# Patient Record
Sex: Female | Born: 1965 | Race: Black or African American | Hispanic: No | Smoking: Former smoker
Health system: Southern US, Community
[De-identification: ages and names within clinical notes are randomized; demographics above are authoritative.]

---

## 2018-07-06 ENCOUNTER — Emergency Department (HOSPITAL_COMMUNITY)
Admission: EM | Admit: 2018-07-06 | Discharge: 2018-07-06 | Disposition: A | Discharge status: Home / Self Care | Payer: Self-pay | Attending: Emergency Medicine | Admitting: Emergency Medicine

## 2018-07-06 ENCOUNTER — Encounter (HOSPITAL_COMMUNITY): Payer: Self-pay | Admitting: Emergency Medicine

## 2018-07-06 ENCOUNTER — Other Ambulatory Visit: Payer: Self-pay

## 2018-07-06 DIAGNOSIS — R69 Illness, unspecified: Secondary | ICD-10-CM

## 2018-07-06 DIAGNOSIS — J111 Influenza due to unidentified influenza virus with other respiratory manifestations: Secondary | ICD-10-CM | POA: Insufficient documentation

## 2018-07-06 NOTE — ED Provider Notes (Signed)
[facility] EMERGENCY DEPARTMENT Provider Note   CSN: [id number] Arrival date & time: 07/06/18  1436    History   Chief Complaint Chief Complaint  Patient presents with  . Sore Throat  . Fever    HPI Sharon Mullins is a 53 y.o. female.     Patient presents with fever to 101, scratchy throat, nasal congestion, occasional non prod cough, body aches, in past few days. Symptoms acute onset, mild-moderate, persistent, constant. No sob. No recent travel or known covid exposure. No known ill contacts.   The history is provided by the patient.  Sore Throat  Pertinent negatives include no chest pain, no abdominal pain, no headaches and no shortness of breath.  Fever  Associated symptoms: cough, myalgias, rhinorrhea and sore throat   Associated symptoms: no chest pain, no chills, no confusion, no diarrhea, no headaches, no rash and no vomiting     History reviewed. No pertinent past medical history.  There are no active problems to display for this patient.   History reviewed. No pertinent surgical history.   OB History   No obstetric history on file.      Home Medications    Prior to Admission medications   Not on File    Family History No family history on file.  Social History Social History   Tobacco Use  . Smoking status: Not on file  Substance Use Topics  . Alcohol use: Not on file  . Drug use: Not on file     Allergies   Patient has no allergy information on record.   Review of Systems Review of Systems  Constitutional: Positive for fever. Negative for chills.  HENT: Positive for rhinorrhea and sore throat.   Eyes: Negative for redness.  Respiratory: Positive for cough. Negative for shortness of breath.   Cardiovascular: Negative for chest pain.  Gastrointestinal: Negative for abdominal pain, diarrhea and vomiting.  Genitourinary: Negative for flank pain.  Musculoskeletal: Positive for myalgias. Negative for back pain and neck  pain.  Skin: Negative for rash.  Neurological: Negative for headaches.  Hematological: Does not bruise/bleed easily.  Psychiatric/Behavioral: Negative for confusion.     Physical Exam Updated Vital Signs BP 135/85 (BP Location: Right Arm)   Pulse 90   Temp 97.8 F (36.6 C) (Oral)   Resp 18   Ht 1.499 m (4\' 11" )   Wt 76.2 kg   LMP 07/03/2018 (Exact Date)   SpO2 100%   BMI 33.93 kg/m   Physical Exam Vitals signs and nursing note reviewed.  Constitutional:      Appearance: Normal appearance. She is well-developed.  HENT:     Head: Atraumatic.     Right Ear: Tympanic membrane normal.     Left Ear: Tympanic membrane normal.     Nose: Congestion present.     Mouth/Throat:     Mouth: Mucous membranes are moist.     Pharynx: Oropharynx is clear. No oropharyngeal exudate or posterior oropharyngeal erythema.  Eyes:     General: No scleral icterus.    Conjunctiva/sclera: Conjunctivae normal.     Pupils: Pupils are equal, round, and reactive to light.  Neck:     Musculoskeletal: Normal range of motion and neck supple. No neck rigidity or muscular tenderness.     Trachea: No tracheal deviation.  Cardiovascular:     Rate and Rhythm: Normal rate and regular rhythm.     Pulses: Normal pulses.     Heart sounds: Normal heart sounds. No murmur.  No friction rub. No gallop.   Pulmonary:     Effort: Pulmonary effort is normal. No respiratory distress.     Breath sounds: Normal breath sounds.  Abdominal:     General: Bowel sounds are normal. There is no distension.     Palpations: Abdomen is soft.     Tenderness: There is no abdominal tenderness. There is no guarding.  Genitourinary:    Comments: No cva tenderness.  Musculoskeletal:        General: No swelling.  Lymphadenopathy:     Cervical: No cervical adenopathy.  Skin:    General: Skin is warm and dry.     Findings: No rash.  Neurological:     Mental Status: She is alert.     Comments: Alert, speech normal.   Psychiatric:         Mood and Affect: Mood normal.      ED Treatments / Results  Labs (all labs ordered are listed, but only abnormal results are displayed) Labs Reviewed - No data to display  EKG None  Radiology No results found.  Procedures Procedures (including critical care time)  Medications Ordered in ED Medications - No data to display   Initial Impression / Assessment and Plan / ED Course  I have reviewed the triage vital signs and the nursing notes.  Pertinent labs & imaging results that were available during my care of the patient were reviewed by me and considered in my medical decision making (see chart for details).  Reviewed nursing notes and prior charts for additional history.   Sharon Mullins was evaluated in Emergency Department on 07/06/2018 for the symptoms described in the history of present illness. She was evaluated in the context of the global COVID-19 pandemic, which necessitated consideration that the patient might be at risk for infection with the SARS-CoV-2 virus that causes COVID-19. Institutional protocols and algorithms that pertain to the evaluation of patients at risk for COVID-19 are in a state of rapid change based on information released by regulatory bodies including the CDC and federal and state organizations. These policies and algorithms were followed during the patient's care in the ED.  Patients exam/symptoms c/w viral uri.   Provided/reinforced quarantine instructions.   Patient appears stable for d/c.     Final Clinical Impressions(s) / ED Diagnoses   Final diagnoses:  None    ED Discharge Orders    None       Cathren Laine, MD 07/06/18 1506

## 2018-07-06 NOTE — ED Triage Notes (Signed)
Pt having sore throat headache and fever since last night. Tempo at home 101. Temp on arrival 97.8. pT HAVING GENERALIZED BODY ACHES

## 2018-07-06 NOTE — Discharge Instructions (Signed)
It was our pleasure to provide your ER care today - we hope that you feel better.  Stay at home for atleast 7 days after onset of symptoms, and atleast 3 days after resolution of fever/cough.   Coronavirus (COVID-19) Are you at risk?  Are you at risk for the Coronavirus (COVID-19)?  To be considered HIGH RISK for Coronavirus (COVID-19), you have to meet the following criteria:  Traveled to [place], [place], [place], [place] or [place]; or in the [place] to [place], [place], [place], or [place]; and have fever, cough, and shortness of breath within the last 2 weeks of travel OR Been in close contact with a person diagnosed with COVID-19 within the last 2 weeks and have fever, cough, and shortness of breath IF YOU DO NOT MEET THESE CRITERIA, YOU ARE CONSIDERED LOW RISK FOR COVID-19.  What to do if you are HIGH RISK for COVID-19?  If you are having a medical emergency, call 911. Seek medical care right away. Before you go to a doctors office, urgent care or emergency department, call ahead and tell them about your recent travel, contact with someone diagnosed with COVID-19, and your symptoms. You should receive instructions from your physicians office regarding next steps of care.  When you arrive at healthcare provider, tell the healthcare staff immediately you have returned from visiting [place], [place], [place], [place] or [place]; or traveled in the [place] to [place], [place], [place], or [place]; in the last two weeks or you have been in close contact with a person diagnosed with COVID-19 in the last 2 weeks.   Tell the health care staff about your symptoms: fever, cough and shortness of breath. After you have been seen by a medical provider, you will be either: Tested for (COVID-19) and discharged home on quarantine except to seek medical care if symptoms worsen, and asked to  Stay home and avoid contact with others until you get your results (4-5 days)  Avoid  travel on public transportation if possible (such as bus, train, or airplane) or Sent to the Emergency Department by EMS for evaluation, COVID-19 testing, and possible admission depending on your condition and test results.  What to do if you are LOW RISK for COVID-19?  Reduce your risk of any infection by using the same precautions used for avoiding the common cold or flu:  Wash your hands often with soap and warm water for at least 20 seconds.  If soap and water are not readily available, use an alcohol-based hand sanitizer with at least 60% alcohol.  If coughing or sneezing, cover your mouth and nose by coughing or sneezing into the elbow areas of your shirt or coat, into a tissue or into your sleeve (not your hands). Avoid shaking hands with others and consider head nods or verbal greetings only. Avoid touching your eyes, nose, or mouth with unwashed hands.  Avoid close contact with people who are sick. Avoid places or events with large numbers of people in one location, like concerts or sporting events. Carefully consider travel plans you have or are making. If you are planning any travel outside or inside the [place], visit the [facility] webpage for the latest health notices. If you have some symptoms but not all symptoms, continue to monitor at home and seek medical attention if your symptoms worsen. If you are having a medical emergency, call 911.   ADDITIONAL HEALTHCARE OPTIONS FOR PATIENTS  [facility] / e-Visit:  [email or web address]         [facility]: [phone]  [facility]: [phone]                   [facility]: [phone]       Person Under Monitoring Name: Karolee OhsLisa Friedel  Location[place] [address] [place] [id number]   Infection Prevention Recommendations for Individuals Confirmed to have, or Being Evaluated for, 2019 Novel Coronavirus (COVID-19) Infection  Who Receive Care at Home  Individuals who are confirmed to have, or are being evaluated for, COVID-19 should follow the prevention steps below until a healthcare provider or local or state health department says they can return to normal activities.  Stay home except to get medical care You should restrict activities outside your home, except for getting medical care. Do not go to work, school, or public areas, and do not use public transportation or taxis.  Call ahead before visiting your doctor Before your medical appointment, call the healthcare provider and tell them that you have, or are being evaluated for, COVID-19 infection. This will help the healthcare providers office take steps to keep other people from getting infected. Ask your healthcare provider to call the local or state health department.  Monitor your symptoms Seek prompt medical attention if your illness is worsening (e.g., difficulty breathing). Before going to your medical appointment, call the healthcare provider and tell them that you have, or are being evaluated for, COVID-19 infection. Ask your healthcare provider to call the local or state health department.  Wear a facemask You should wear a facemask that covers your nose and mouth when you are in the same room with other people and when you visit a healthcare provider. People who live with or visit you should also wear a facemask while they are in the same room with you.  Separate yourself from other people in your home As much as possible, you should stay in a different room from other people in your home. Also, you should use a separate bathroom, if available.  Avoid sharing household items You should not share dishes, drinking glasses, cups, eating utensils, towels, bedding, or other items with other people in your home. After using these items, you should wash them thoroughly with soap and water.  Cover your coughs and sneezes Cover your mouth and  nose with a tissue when you cough or sneeze, or you can cough or sneeze into your sleeve. Throw used tissues in a lined trash can, and immediately wash your hands with soap and water for at least 20 seconds or use an alcohol-based hand rub.  Wash your [employer]hands Wash your hands often and thoroughly with soap and water for at least 20 seconds. You can use an alcohol-based hand sanitizer if soap and water are not available and if your hands are not visibly dirty. Avoid touching your eyes, nose, and mouth with unwashed hands.   Prevention Steps for Caregivers and Household Members of Individuals Confirmed to have, or Being Evaluated for, COVID-19 Infection Being Cared for in the Home  If you live with, or provide care at home for, a person confirmed to have, or being evaluated for, COVID-19 infection please follow these guidelines to prevent infection:  Follow healthcare providers instructions Make sure that you understand and can help the patient follow any healthcare provider instructions for all care.  Provide for the patients basic needs You should help the patient with basic needs in the home and  provide support for getting groceries, prescriptions, and other personal needs.  Monitor the patients symptoms If they are getting sicker, call his or her medical provider and tell them that the patient has, or is being evaluated for, COVID-19 infection. This will help the healthcare providers office take steps to keep other people from getting infected. Ask the healthcare provider to call the local or state health department.  Limit the number of people who have contact with the patient If possible, have only one caregiver for the patient. Other household members should stay in another home or place of residence. If this is not possible, they should stay in another room, or be separated from the patient as much as possible. Use a separate bathroom, if available. Restrict visitors who do not  have an essential need to be in the home.  Keep older adults, very young children, and other sick people away from the patient Keep older adults, very young children, and those who have compromised immune systems or chronic health conditions away from the patient. This includes people with chronic heart, lung, or kidney conditions, diabetes, and cancer.  Ensure good ventilation Make sure that shared spaces in the home have good air flow, such as from an air conditioner or an opened window, weather permitting.  Wash your hands often Wash your hands often and thoroughly with soap and water for at least 20 seconds. You can use an alcohol based hand sanitizer if soap and water are not available and if your hands are not visibly dirty. Avoid touching your eyes, nose, and mouth with unwashed hands. Use disposable paper towels to dry your hands. If not available, use dedicated cloth towels and replace them when they become wet.  Wear a facemask and gloves Wear a disposable facemask at all times in the room and gloves when you touch or have contact with the patients blood, body fluids, and/or secretions or excretions, such as sweat, saliva, sputum, nasal mucus, vomit, urine, or feces.  Ensure the mask fits over your nose and mouth tightly, and do not touch it during use. Throw out disposable facemasks and gloves after using them. Do not reuse. Wash your hands immediately after removing your facemask and gloves. If your personal clothing becomes contaminated, carefully remove clothing and launder. Wash your hands after handling contaminated clothing. Place all used disposable facemasks, gloves, and other waste in a lined container before disposing them with other household waste. Remove gloves and wash your hands immediately after handling these items.  Do not share dishes, glasses, or other household items with the patient Avoid sharing household items. You should not share dishes, drinking glasses,  cups, eating utensils, towels, bedding, or other items with a patient who is confirmed to have, or being evaluated for, COVID-19 infection. After the person uses these items, you should wash them thoroughly with soap and water.  Wash laundry thoroughly Immediately remove and wash clothes or bedding that have blood, body fluids, and/or secretions or excretions, such as sweat, saliva, sputum, nasal mucus, vomit, urine, or feces, on them. Wear gloves when handling laundry from the patient. Read and follow directions on labels of laundry or clothing items and detergent. In general, wash and dry with the warmest temperatures recommended on the label.  Clean all areas the individual has used often Clean all touchable surfaces, such as counters, tabletops, doorknobs, bathroom fixtures, toilets, phones, keyboards, tablets, and bedside tables, every day. Also, clean any surfaces that may have blood, body fluids, and/or secretions or  excretions on them. Wear gloves when cleaning surfaces the patient has come in contact with. Use a diluted bleach solution (e.g., dilute bleach with 1 part bleach and 10 parts water) or a household disinfectant with a label that says EPA-registered for coronaviruses. To make a bleach solution at home, add 1 tablespoon of bleach to 1 quart (4 cups) of water. For a larger supply, add  cup of bleach to 1 gallon (16 cups) of water. Read labels of cleaning products and follow recommendations provided on product labels. Labels contain instructions for safe and effective use of the cleaning product including precautions you should take when applying the product, such as wearing gloves or eye protection and making sure you have good ventilation during use of the product. Remove gloves and wash hands immediately after cleaning.  Monitor yourself for signs and symptoms of illness Caregivers and household members are considered close contacts, should monitor their health, and will be asked  to limit movement outside of the home to the extent possible. Follow the monitoring steps for close contacts listed on the symptom monitoring form.   ? If you have additional questions, contact your local health department or call the epidemiologist on call at [phone] (available 24/7). ? This guidance is subject to change. For the most up-to-date guidance from [facility], please refer to their website: [email or web address]

## 2019-06-01 ENCOUNTER — Emergency Department (HOSPITAL_COMMUNITY)
Admission: EM | Admit: 2019-06-01 | Discharge: 2019-06-01 | Disposition: A | Discharge status: Home / Self Care | Payer: Self-pay | Attending: Emergency Medicine | Admitting: Emergency Medicine

## 2019-06-01 ENCOUNTER — Other Ambulatory Visit: Payer: Self-pay

## 2019-06-01 ENCOUNTER — Encounter (HOSPITAL_COMMUNITY): Payer: Self-pay

## 2019-06-01 DIAGNOSIS — Y929 Unspecified place or not applicable: Secondary | ICD-10-CM | POA: Insufficient documentation

## 2019-06-01 DIAGNOSIS — Y939 Activity, unspecified: Secondary | ICD-10-CM | POA: Insufficient documentation

## 2019-06-01 DIAGNOSIS — Y999 Unspecified external cause status: Secondary | ICD-10-CM | POA: Insufficient documentation

## 2019-06-01 DIAGNOSIS — M791 Myalgia, unspecified site: Secondary | ICD-10-CM | POA: Insufficient documentation

## 2019-06-01 DIAGNOSIS — W19XXXA Unspecified fall, initial encounter: Secondary | ICD-10-CM

## 2019-06-01 DIAGNOSIS — W109XXA Fall (on) (from) unspecified stairs and steps, initial encounter: Secondary | ICD-10-CM | POA: Insufficient documentation

## 2019-06-01 MED ORDER — METHOCARBAMOL 500 MG PO TABS: 500.0000 mg | ORAL_TABLET | Freq: Two times a day (BID) | ORAL | 0 refills | Status: DC

## 2019-06-01 NOTE — ED Triage Notes (Signed)
Pt reports she slipped on ice, falling down about 10 steps yesterday. Denies LOC. Pt c.o pain on the right side of her body from the shoulder down to her leg. Pt ambulatory.

## 2019-06-01 NOTE — ED Provider Notes (Signed)
[facility] EMERGENCY DEPARTMENT Provider Note   CSN: [id number] Arrival date & time: 06/01/19  1516     History Chief Complaint  Patient presents with  . Fall    Sharon Mullins is a 54 y.o. female.  HPI  Patient is a 54 year old female with no significant past medical history presented today for diffuse body aches/muscle aches that began after 9 AM yesterday when she slipped on some metal steps and slid down 10 steps to the ground.  Patient states she not hit her head, did not lose consciousness, did not sustain any lacerations or abrasions.  She states she was wearing a thick coat heavy clothes which cushions her back somewhat.  However she states that when she woke up this morning she felt much more achy than she did yesterday.  She states that her pain is primarily on her right side of her body.  She states she is having some right-sided low back pain as well as stiffness and aches in her right shoulder.  Patient denies any headache, dizziness, lightheadedness.  She states that her fall was purely mechanical because of ice and denies any chest pain, palpitations, shortness of breath triggering her fall.  She also denies any other symptoms after a fall.  States that she was ambulatory immediately after incident.     History reviewed. No pertinent past medical history.  There are no problems to display for this patient.   History reviewed. No pertinent surgical history.   OB History   No obstetric history on file.     No family history on file.  Social History   Tobacco Use  . Smoking status: Not on file  Substance Use Topics  . Alcohol use: Not on file  . Drug use: Not on file    Home Medications Prior to Admission medications   Medication Sig Start Date End Date Taking? Authorizing Provider  methocarbamol (ROBAXIN) 500 MG tablet Take 1 tablet (500 mg total) by mouth 2 (two) times daily. 06/01/19   Gailen Shelter, PA    Allergies    Patient  has no allergy information on record.  Review of Systems   Review of Systems  Constitutional: Negative for fever.  HENT: Negative for congestion.   Respiratory: Negative for shortness of breath.   Cardiovascular: Negative for chest pain.  Gastrointestinal: Negative for abdominal distention.  Musculoskeletal:       Right-sided low back pain, right shoulder pain at the back  Neurological: Negative for dizziness, tremors, syncope, speech difficulty, weakness, light-headedness, numbness and headaches.    Physical Exam Updated Vital Signs BP (!) 154/87 (BP Location: Right Arm)   Pulse 88   Temp 98.4 F (36.9 C) (Oral)   Resp 14   SpO2 100%   Physical Exam Vitals and nursing note reviewed.  Constitutional:      General: She is not in acute distress.    Comments: Patient is pleasant 54 year old female in no acute distress sitting comfortably in bed  HENT:     Head: Normocephalic and atraumatic.     Nose: Nose normal.  Eyes:     General: No scleral icterus. Neck:     Comments: No tenderness to palpation of neck. Cardiovascular:     Rate and Rhythm: Normal rate and regular rhythm.     Pulses: Normal pulses.     Heart sounds: Normal heart sounds.  Pulmonary:     Effort: Pulmonary effort is normal. No respiratory distress.  Breath sounds: No wheezing.  Abdominal:     Palpations: Abdomen is soft.     Tenderness: There is no abdominal tenderness. There is no guarding or rebound.  Musculoskeletal:     Cervical back: Normal range of motion.     Right lower leg: No edema.     Left lower leg: No edema.     Comments: Diffuse tenderness to palpation of muscle groups of the right lower back.  She has no midline spinal tenderness to palpation.  No tenderness of hips, knees, ankles, feet, wrists, or shoulder joints.  She has some moderate tenderness to palpation of the left and right trapezius with again no midline thoracic vertebral tenderness.  Skin:    General: Skin is warm and  dry.     Capillary Refill: Capillary refill takes less than 2 seconds.  Neurological:     Mental Status: She is alert. Mental status is at baseline.     Comments: Alert and oriented to self, place, time and event.   Speech is fluent, clear without dysarthria or dysphasia.   Strength 5/5 in upper/lower extremities  Sensation intact in upper/lower extremities   Normal gait.  Negative Romberg. No pronator drift.  Normal finger-to-nose and feet tapping.   Psychiatric:        Mood and Affect: Mood normal.        Behavior: Behavior normal.     ED Results / Procedures / Treatments   Labs (all labs ordered are listed, but only abnormal results are displayed) Labs Reviewed - No data to display  EKG None  Radiology No results found.  Procedures Procedures (including critical care time)  Medications Ordered in ED Medications - No data to display  ED Course  I have reviewed the triage vital signs and the nursing notes.  Pertinent labs & imaging results that were available during my care of the patient were reviewed by me and considered in my medical decision making (see chart for details).    MDM Rules/Calculators/A&P                      Patient is well-appearing 54 year old female presented today for muscle aches after a fall that occurred yesterday.  She had a mechanical fall denies any symptoms that would trigger concern for syncopal episode or seizure.  After she slipped on ice and slid down the stairs she states that she had some tenderness of her back and shoulders however this when she woke up this felt very stiff.  She felt somewhat better after getting up but has not taken any Tylenol or ibuprofen yet today.  Physical exam is unremarkable although she does have some muscular tenderness of low back and bilateral shoulders.  No neurologic abnormalities, no discrepancies in strength.  She is well-appearing.  Vital signs are within normal limits.  No abdominal or chest wall  tenderness abrasions or contusions.  Doubt intrathoracic or intra-abdominal hemorrhage.  We will provide patient with prescription for Robaxin and give recommendations for Tylenol and ibuprofen dosing.  We will give patient return precautions and discharge patient in good condition with vital signs within normal limits and good understanding of plan.   Final Clinical Impression(s) / ED Diagnoses Final diagnoses:  Fall, initial encounter  Muscle pain    Rx / DC Orders ED Discharge Orders         Ordered    methocarbamol (ROBAXIN) 500 MG tablet  2 times daily     06/01/19 1610  Gailen Shelter, [place] 06/01/19 1616    Virgina Norfolk, DO 06/01/19 1730

## 2019-06-01 NOTE — Discharge Instructions (Addendum)
Please rest and use warm salt water soaks.  Gentle stretching is encouraged.  You may also do some gentle exercises such as walking.  Please use Tylenol ibuprofen for pain I also prescribed you Robaxin which he can use at nighttime.  Please use Tylenol or ibuprofen for pain.  You may use 600 mg ibuprofen every 6 hours or 1000 mg of Tylenol every 6 hours.  You may choose to alternate between the 2.  This would be most effective.  Not to exceed 4 g of Tylenol within 24 hours.  Not to exceed 3200 mg ibuprofen 24 hours.

## 2019-06-25 ENCOUNTER — Ambulatory Visit: Payer: Self-pay | Admitting: Internal Medicine

## 2019-06-25 DIAGNOSIS — M545 Low back pain, unspecified: Secondary | ICD-10-CM

## 2019-06-25 DIAGNOSIS — Z0000 Encounter for general adult medical examination without abnormal findings: Secondary | ICD-10-CM

## 2019-06-25 NOTE — Patient Instructions (Addendum)
You were seen in the clinic to establish care.  I would like to refer you to an eye doctor and also check some basic labs.  However, to minimize your expense, it would be good idea to have you set up with financial assistance before ordering these tests.  For your back pain, continue taking Aleve.  Do not exceed the recommended dosage on the bottle.  Please follow-up in a few weeks if your back pain continues  Thank you for allowing [place] to be part of your medical care!

## 2019-06-26 DIAGNOSIS — M545 Low back pain, unspecified: Secondary | ICD-10-CM | POA: Insufficient documentation

## 2019-06-26 DIAGNOSIS — Z0000 Encounter for general adult medical examination without abnormal findings: Secondary | ICD-10-CM | POA: Insufficient documentation

## 2019-06-26 NOTE — Progress Notes (Signed)
Internal Medicine Clinic Attending  Case discussed with Dr. MacLean at the time of the visit.  We reviewed the resident's history and exam and pertinent patient test results.  I agree with the assessment, diagnosis, and plan of care documented in the resident's note.  Sharon Mullins, M.D., Ph.D.  

## 2019-06-26 NOTE — Assessment & Plan Note (Addendum)
Patient last saw provider in 2018.  Patient has appointment with financial counselor on July 16, 2019.  After assistance is required, it would be reasonable to check hemoglobin A1c, lipid panel, address patient care gaps.  No urgent need to evaluate these at this time.

## 2019-06-26 NOTE — Progress Notes (Signed)
   CC: Establish care and back pain  HPI: Patient is a 54 year old female with no significant past medical history who presents to establish care.  PMH: Denies past medical history PSH: Denies past surgical history  Allergies: Denies known allergies  Family History: Mother with diabetes and hypertension  Review of Systems:   Review of Systems  Constitutional: Negative for fever.  Respiratory: Negative for shortness of breath.   Cardiovascular: Negative for chest pain.  Gastrointestinal: Negative for abdominal pain.  All other systems reviewed and are negative.  Physical Exam:  Vitals:   06/25/19 1345  BP: 129/75  Pulse: 78  Temp: 98.4 F (36.9 C)  TempSrc: Oral  SpO2: 100%   Physical Exam  Constitutional: She is well-developed, well-nourished, and in no distress.  HENT:  Head: Normocephalic and atraumatic.  Eyes: EOM are normal. Right eye exhibits no discharge. Left eye exhibits no discharge.  Neck: No tracheal deviation present.  Cardiovascular: Normal rate and regular rhythm. Exam reveals no gallop and no friction rub.  No murmur heard. Pulmonary/Chest: Effort normal and breath sounds normal. No respiratory distress. She has no wheezes. She has no rales.  Abdominal: Soft. She exhibits no distension. There is no abdominal tenderness. There is no rebound and no guarding.  Musculoskeletal:        General: No deformity or edema. Normal range of motion.     Cervical back: Normal range of motion.     Comments: Mild paraspinal muscle tenderness to palpation. No point tenderness on spine. Range of motion with flexion, extension, and rotation of spine does not elicit pain  Neurological: She is alert. Coordination normal.  5/5 strength bilaterally, sensation in tact  Skin: Skin is warm and dry. No rash noted. She is not diaphoretic. No erythema.  Psychiatric: Memory and judgment normal.     Assessment & Plan:   See Encounters Tab for problem based charting.  Patient  discussed with Dr. Sandre Kitty

## 2019-06-26 NOTE — Assessment & Plan Note (Addendum)
Patient reports that she went to the emergency room on 06/01/2019 for back pain which started about a week after a fall.  Patient reports pain is on bilateral spine, it is a sharp nonradiating pain. On review of ER encounter, patient was discharged with flexeril - patient has not taken this as it makes her feel "out of it".  Patient reports taking 1-2 regualr strength naproxen, infrequently, but at most twice a day which helps with the pain.  Patient denies changes in strength or sensation, fever, or chills.  Findings on exam consistent with paraspinal muscle strain, no concerning findings.  Plan: *Patient provided with handout on stretches and exercises to improve her lower back pain *Could consider physical therapy referral in future if pain persists, although this may be a significant financial burden given uninsured status

## 2019-07-16 ENCOUNTER — Ambulatory Visit: Payer: Self-pay

## 2020-08-21 ENCOUNTER — Institutional Professional Consult (permissible substitution): Payer: Self-pay | Admitting: Plastic Surgery

## 2020-08-21 DIAGNOSIS — Z79899 Other long term (current) drug therapy: Secondary | ICD-10-CM | POA: Diagnosis not present

## 2020-08-21 DIAGNOSIS — Z13220 Encounter for screening for lipoid disorders: Secondary | ICD-10-CM | POA: Diagnosis not present

## 2020-08-21 DIAGNOSIS — D649 Anemia, unspecified: Secondary | ICD-10-CM | POA: Diagnosis not present

## 2020-08-21 DIAGNOSIS — Z1231 Encounter for screening mammogram for malignant neoplasm of breast: Secondary | ICD-10-CM | POA: Diagnosis not present

## 2020-08-21 DIAGNOSIS — M79672 Pain in left foot: Secondary | ICD-10-CM | POA: Diagnosis not present

## 2020-08-21 DIAGNOSIS — R030 Elevated blood-pressure reading, without diagnosis of hypertension: Secondary | ICD-10-CM | POA: Diagnosis not present

## 2020-08-21 DIAGNOSIS — M25569 Pain in unspecified knee: Secondary | ICD-10-CM | POA: Diagnosis not present

## 2020-08-22 ENCOUNTER — Other Ambulatory Visit: Payer: Self-pay | Admitting: Family Medicine

## 2020-08-22 DIAGNOSIS — Z1231 Encounter for screening mammogram for malignant neoplasm of breast: Secondary | ICD-10-CM

## 2020-08-26 ENCOUNTER — Ambulatory Visit: Payer: Self-pay

## 2020-08-26 DIAGNOSIS — Z86018 Personal history of other benign neoplasm: Secondary | ICD-10-CM | POA: Diagnosis not present

## 2020-08-26 DIAGNOSIS — Z01419 Encounter for gynecological examination (general) (routine) without abnormal findings: Secondary | ICD-10-CM | POA: Diagnosis not present

## 2020-10-08 ENCOUNTER — Institutional Professional Consult (permissible substitution): Payer: Self-pay | Admitting: Plastic Surgery

## 2021-01-20 DIAGNOSIS — G8929 Other chronic pain: Secondary | ICD-10-CM | POA: Diagnosis not present

## 2021-01-20 DIAGNOSIS — Z0000 Encounter for general adult medical examination without abnormal findings: Secondary | ICD-10-CM | POA: Diagnosis not present

## 2021-01-20 DIAGNOSIS — D500 Iron deficiency anemia secondary to blood loss (chronic): Secondary | ICD-10-CM | POA: Diagnosis not present

## 2021-01-20 DIAGNOSIS — M25561 Pain in right knee: Secondary | ICD-10-CM | POA: Diagnosis not present

## 2021-01-20 DIAGNOSIS — M25562 Pain in left knee: Secondary | ICD-10-CM | POA: Diagnosis not present

## 2021-03-09 DIAGNOSIS — N898 Other specified noninflammatory disorders of vagina: Secondary | ICD-10-CM | POA: Diagnosis not present

## 2021-03-09 DIAGNOSIS — T192XXA Foreign body in vulva and vagina, initial encounter: Secondary | ICD-10-CM | POA: Diagnosis not present

## 2021-06-05 ENCOUNTER — Ambulatory Visit
Admission: RE | Admit: 2021-06-05 | Discharge: 2021-06-05 | Disposition: A | Discharge status: Home / Self Care | Payer: BC Managed Care – PPO | Source: Ambulatory Visit | Attending: Family Medicine | Admitting: Family Medicine

## 2021-06-05 DIAGNOSIS — Z1231 Encounter for screening mammogram for malignant neoplasm of breast: Secondary | ICD-10-CM

## 2021-08-27 DIAGNOSIS — M9903 Segmental and somatic dysfunction of lumbar region: Secondary | ICD-10-CM | POA: Diagnosis not present

## 2021-08-27 DIAGNOSIS — M62830 Muscle spasm of back: Secondary | ICD-10-CM | POA: Diagnosis not present

## 2021-08-27 DIAGNOSIS — M542 Cervicalgia: Secondary | ICD-10-CM | POA: Diagnosis not present

## 2021-08-27 DIAGNOSIS — M9901 Segmental and somatic dysfunction of cervical region: Secondary | ICD-10-CM | POA: Diagnosis not present

## 2021-09-01 DIAGNOSIS — M542 Cervicalgia: Secondary | ICD-10-CM | POA: Diagnosis not present

## 2021-09-01 DIAGNOSIS — N62 Hypertrophy of breast: Secondary | ICD-10-CM | POA: Diagnosis not present

## 2021-09-01 DIAGNOSIS — N951 Menopausal and female climacteric states: Secondary | ICD-10-CM | POA: Diagnosis not present

## 2021-09-08 DIAGNOSIS — M62830 Muscle spasm of back: Secondary | ICD-10-CM | POA: Diagnosis not present

## 2021-09-08 DIAGNOSIS — M9903 Segmental and somatic dysfunction of lumbar region: Secondary | ICD-10-CM | POA: Diagnosis not present

## 2021-09-08 DIAGNOSIS — M9901 Segmental and somatic dysfunction of cervical region: Secondary | ICD-10-CM | POA: Diagnosis not present

## 2021-09-08 DIAGNOSIS — M542 Cervicalgia: Secondary | ICD-10-CM | POA: Diagnosis not present

## 2021-10-15 DIAGNOSIS — M62830 Muscle spasm of back: Secondary | ICD-10-CM | POA: Diagnosis not present

## 2021-10-15 DIAGNOSIS — M9901 Segmental and somatic dysfunction of cervical region: Secondary | ICD-10-CM | POA: Diagnosis not present

## 2021-10-15 DIAGNOSIS — M9903 Segmental and somatic dysfunction of lumbar region: Secondary | ICD-10-CM | POA: Diagnosis not present

## 2021-10-15 DIAGNOSIS — M542 Cervicalgia: Secondary | ICD-10-CM | POA: Diagnosis not present

## 2021-12-23 DIAGNOSIS — M9903 Segmental and somatic dysfunction of lumbar region: Secondary | ICD-10-CM | POA: Diagnosis not present

## 2021-12-23 DIAGNOSIS — M542 Cervicalgia: Secondary | ICD-10-CM | POA: Diagnosis not present

## 2021-12-23 DIAGNOSIS — M9901 Segmental and somatic dysfunction of cervical region: Secondary | ICD-10-CM | POA: Diagnosis not present

## 2021-12-23 DIAGNOSIS — M62830 Muscle spasm of back: Secondary | ICD-10-CM | POA: Diagnosis not present

## 2022-01-06 ENCOUNTER — Ambulatory Visit: Payer: BC Managed Care – PPO | Admitting: Plastic Surgery

## 2022-01-06 ENCOUNTER — Encounter: Payer: Self-pay | Admitting: Plastic Surgery

## 2022-01-06 VITALS — HR 70 | Ht 60.0 in | Wt 171.0 lb

## 2022-01-06 DIAGNOSIS — M25511 Pain in right shoulder: Secondary | ICD-10-CM

## 2022-01-06 DIAGNOSIS — M25512 Pain in left shoulder: Secondary | ICD-10-CM

## 2022-01-06 DIAGNOSIS — M542 Cervicalgia: Secondary | ICD-10-CM | POA: Diagnosis not present

## 2022-01-06 DIAGNOSIS — M545 Low back pain, unspecified: Secondary | ICD-10-CM

## 2022-01-06 DIAGNOSIS — N62 Hypertrophy of breast: Secondary | ICD-10-CM | POA: Diagnosis not present

## 2022-01-06 DIAGNOSIS — M546 Pain in thoracic spine: Secondary | ICD-10-CM

## 2022-01-06 DIAGNOSIS — Z6833 Body mass index (BMI) 33.0-33.9, adult: Secondary | ICD-10-CM

## 2022-01-06 NOTE — Progress Notes (Signed)
Referring Provider Romana Juniper, MD [place],  [place] [id number]   CC:  Breast hypertrophy   Sharon Mullins is an 56 y.o. female.  HPI:   The patient is a 56 y.o. female with a history of mammary hyperplasia for several years.  She has extremely large breasts causing symptoms that include the following: Back pain in the upper and lower back, including neck pain. She pulls or pins her bra straps to provide better lift and relief of the pressure and pain. She notices relief by holding her breast up manually.  Her shoulder straps cause grooves and pain and pressure that requires padding for relief. Pain medication is sometimes required with motrin and tylenol.  Activities that are hindered by enlarged breasts include: exercise and running.  She has tried supportive clothing as well as fitted bras without improvement.     Mammogram history: 06/05/21 normal at the breast center.  Family history of breast cancer:   None.  Tobacco use: None.   The patient expresses the desire to pursue surgical intervention.  The patient has been seeing a Restaurant manager, fast food.  She has not done any medical weight loss.  The BMI = 3 33.  Preoperative bra size = 44 double D cup.   No Known Allergies  Outpatient Encounter Medications as of 01/06/2022  Medication Sig   [DISCONTINUED] methocarbamol (ROBAXIN) 500 MG tablet Take 1 tablet (500 mg total) by mouth 2 (two) times daily.   No facility-administered encounter medications on file as of 01/06/2022.     No past medical history on file.  No past surgical history on file.  Family History  Problem Relation Age of Onset   Breast cancer Neg Hx     Social History   Social History Narrative   Not on file     Review of Systems General: Denies fevers, chills, weight loss CV: Denies chest pain, shortness of breath, palpitations   Physical Exam    01/06/2022    3:37 PM 06/25/2019    1:45 PM 06/01/2019    5:13 PM  Vitals with BMI  Height 5'  0"  5\' 0"   Weight 171 lbs  145 lbs  BMI 85.6  31.49  Systolic  702   Diastolic  75   Pulse 70 78     General:  No acute distress,  Alert and oriented, Non-Toxic, Normal speech and affect Breast: No easily palpable breast masses on physical exam, significant breast ptosis and macromastia. Her breasts are extremely large and fairly symmetric with the left slightly larger.  She has hyperpigmentation of the inframammary area on both sides.  The sternal to nipple distance on the right is 35 cm and the left is 36 cm.  The IMF distance is 17 cm on the right and 18 cm on the left.  Base width is 20 bilaterally. Assessment/Plan   The patient has bilateral symptomatic macromastia.  She is going to continue to see her chiropractor and also participate in medical weight loss program.  I will see her back after that to submit her insurance claim.  She is a good candidate for a breast reduction.  She is interested in pursuing surgical treatment.  She has tried supportive garments and fitted bras with no relief.  The details of breast reduction surgery were discussed.  I explained the procedure in detail along the with the expected scars.  The risks were discussed in detail and include bleeding, infection, damage to surrounding structures, need for  additional procedures, nipple loss, change in nipple sensation, persistent pain, contour irregularities and asymmetries.  I explained that breast feeding is often not possible after breast reduction surgery.  We discussed the expected postoperative course with an overall recovery period of about 1 month.  She demonstrated full understanding of all risks.  We discussed her personal risk factors that include high bmi.  The patient is interested in pursuing surgical treatment.  The estimated excess breast tissue to be removed at the time of surgery = 500-700 grams on the left and 500-700 grams on the right. Janne Napoleon 01/06/2022, 4:21 PM

## 2022-02-09 ENCOUNTER — Encounter (INDEPENDENT_AMBULATORY_CARE_PROVIDER_SITE_OTHER): Payer: Self-pay

## 2022-02-15 NOTE — Telephone Encounter (Signed)
Please address

## 2022-03-15 DIAGNOSIS — J0100 Acute maxillary sinusitis, unspecified: Secondary | ICD-10-CM | POA: Diagnosis not present

## 2022-03-15 DIAGNOSIS — G44209 Tension-type headache, unspecified, not intractable: Secondary | ICD-10-CM | POA: Diagnosis not present

## 2022-05-05 DIAGNOSIS — Z131 Encounter for screening for diabetes mellitus: Secondary | ICD-10-CM | POA: Diagnosis not present

## 2022-05-05 DIAGNOSIS — Z0000 Encounter for general adult medical examination without abnormal findings: Secondary | ICD-10-CM | POA: Diagnosis not present

## 2022-05-05 DIAGNOSIS — D500 Iron deficiency anemia secondary to blood loss (chronic): Secondary | ICD-10-CM | POA: Diagnosis not present

## 2022-05-05 DIAGNOSIS — E669 Obesity, unspecified: Secondary | ICD-10-CM | POA: Diagnosis not present

## 2022-05-05 DIAGNOSIS — Z13220 Encounter for screening for lipoid disorders: Secondary | ICD-10-CM | POA: Diagnosis not present

## 2023-03-01 IMAGING — MG MM DIGITAL SCREENING BILAT W/ TOMO AND CAD
6 of 10 series · 6 of 30 positions shown · non-contrast
Comparison: None.

CLINICAL DATA: Screening.

EXAM:
DIGITAL SCREENING BILATERAL MAMMOGRAM WITH TOMOSYNTHESIS AND CAD
TECHNIQUE: Bilateral screening digital craniocaudal and mediolateral oblique
mammograms were obtained. Bilateral screening digital breast
tomosynthesis was performed. The images were evaluated with
computer-aided detection.

[L MLO synth-2D (1 of 2)]
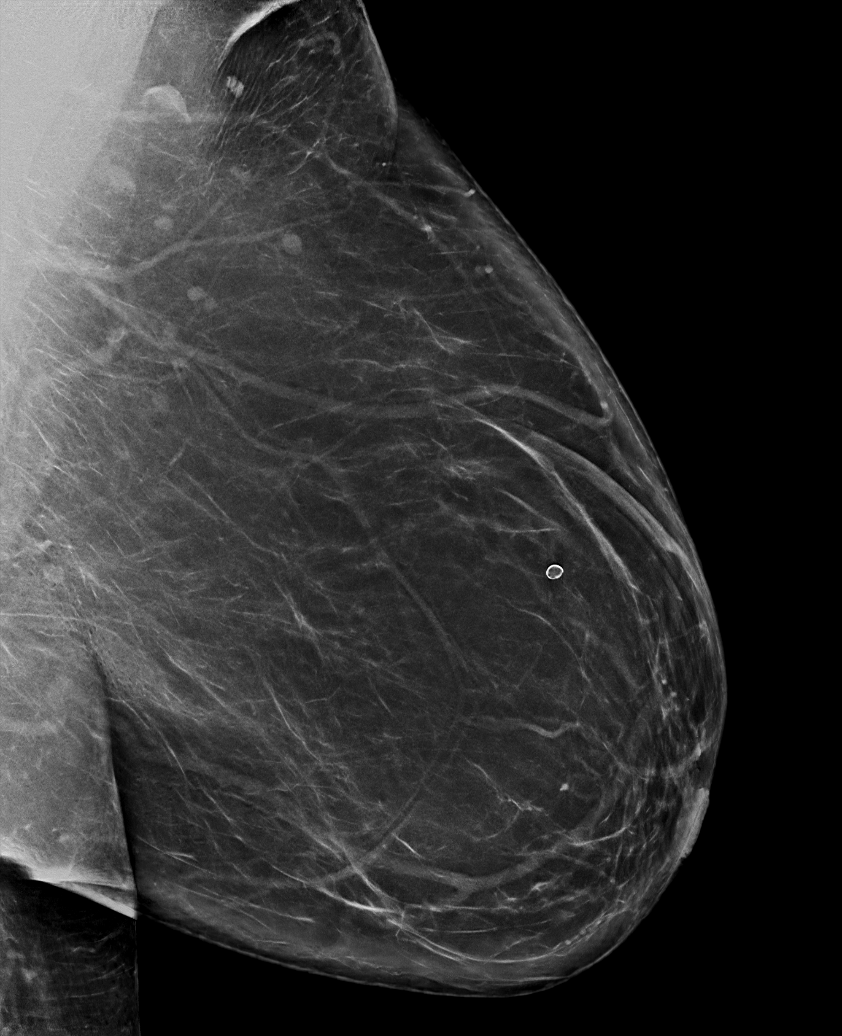

[R MLO synth-2D]
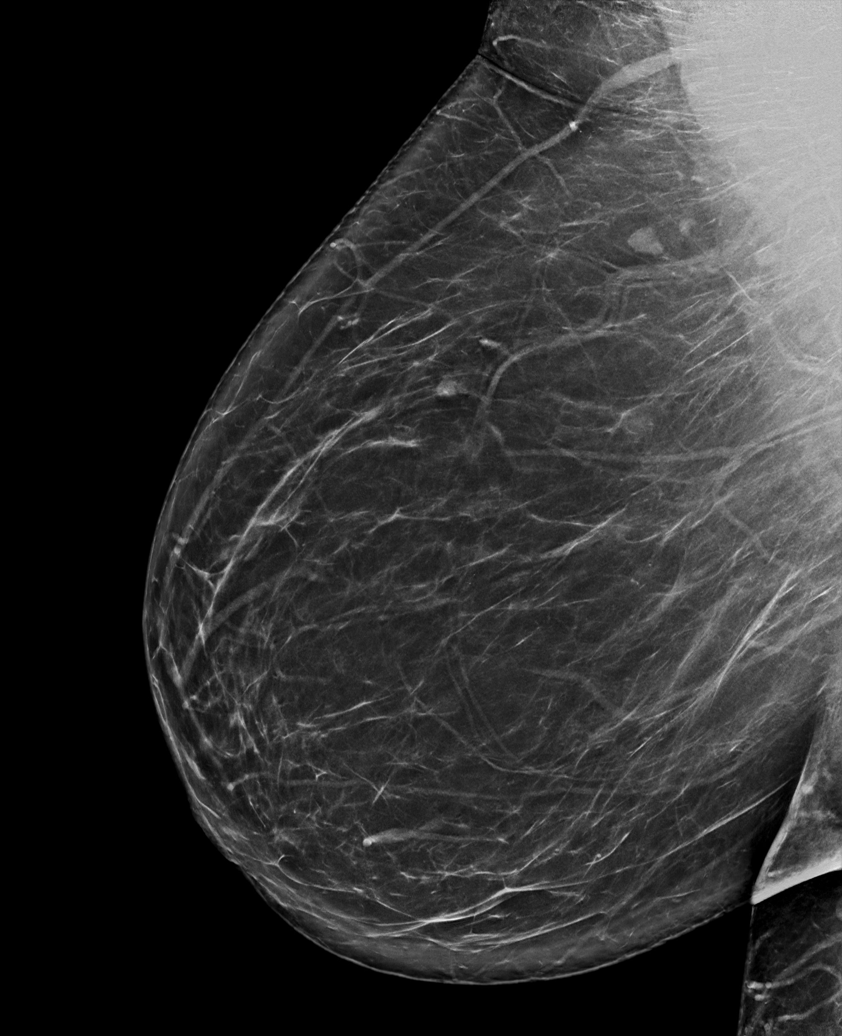

[L MLO synth-2D (2 of 2)]
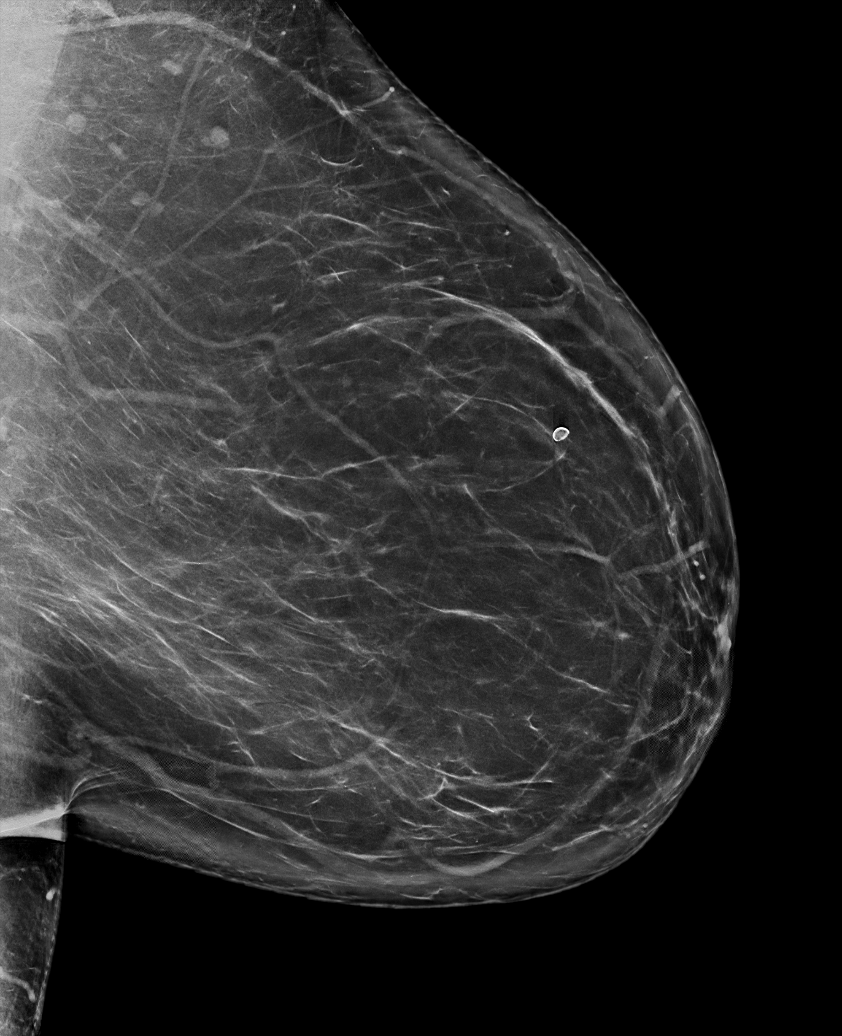

[L CC synth-2D]
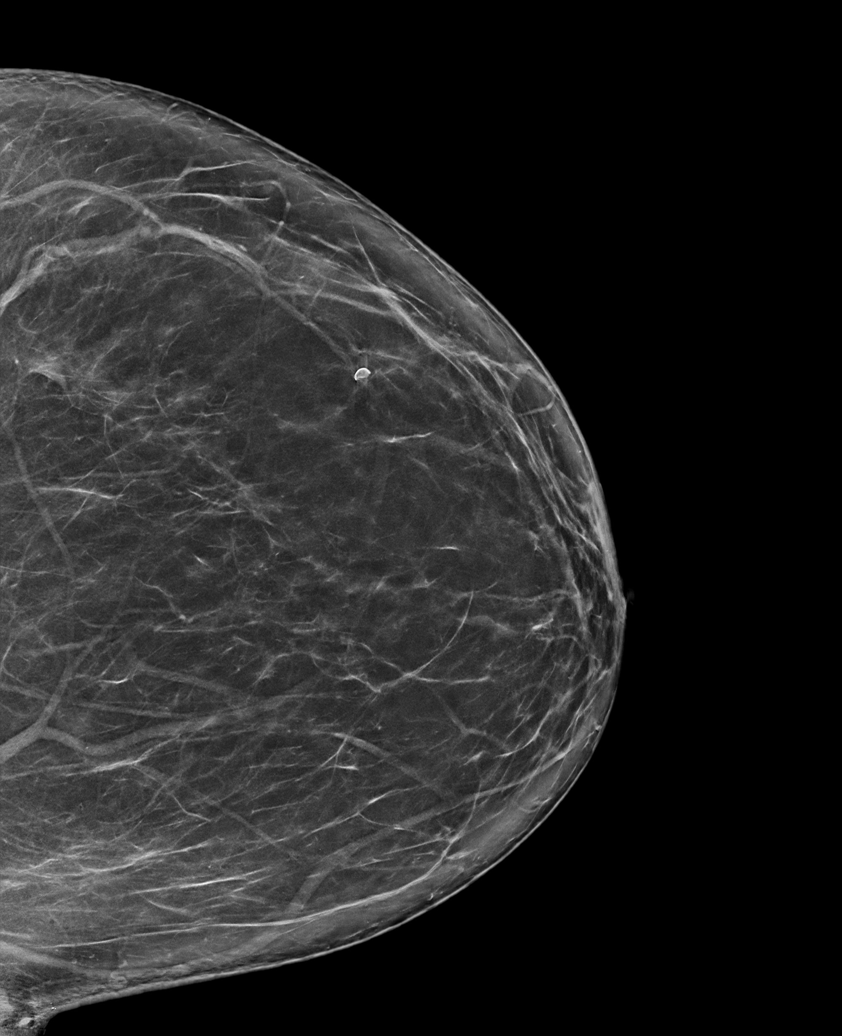

[R CC synth-2D]
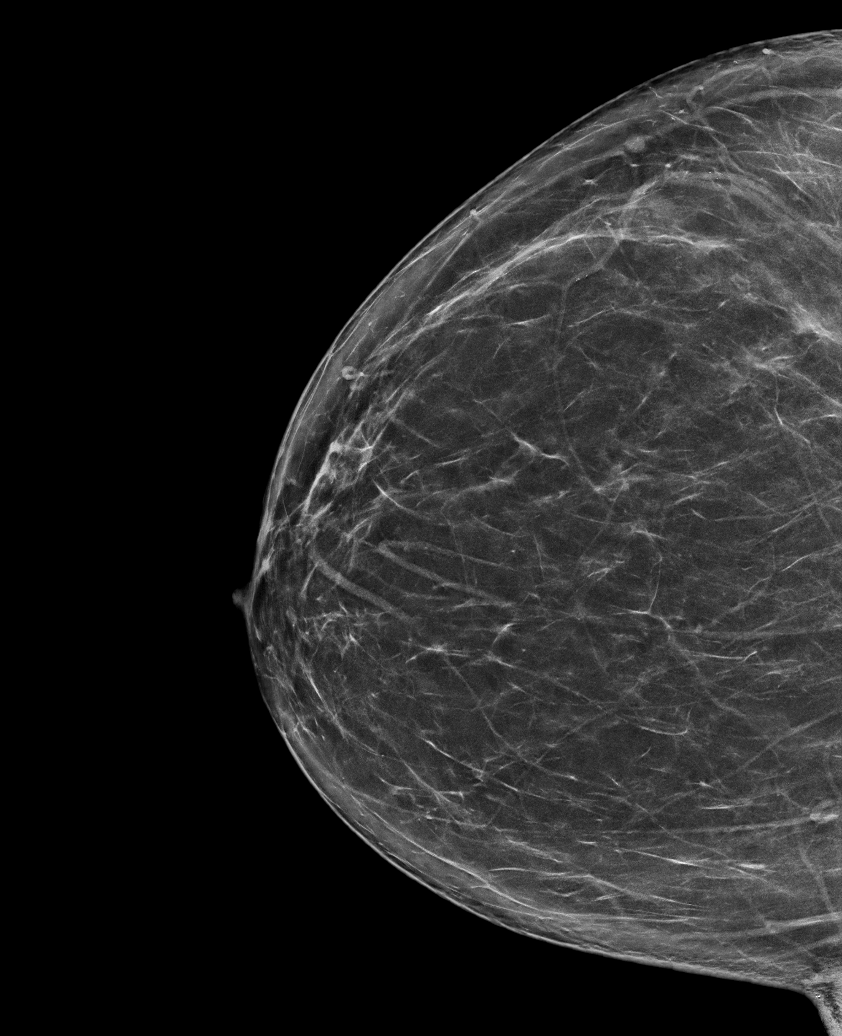

[R CC tomo · tomo slice 41/81.0]
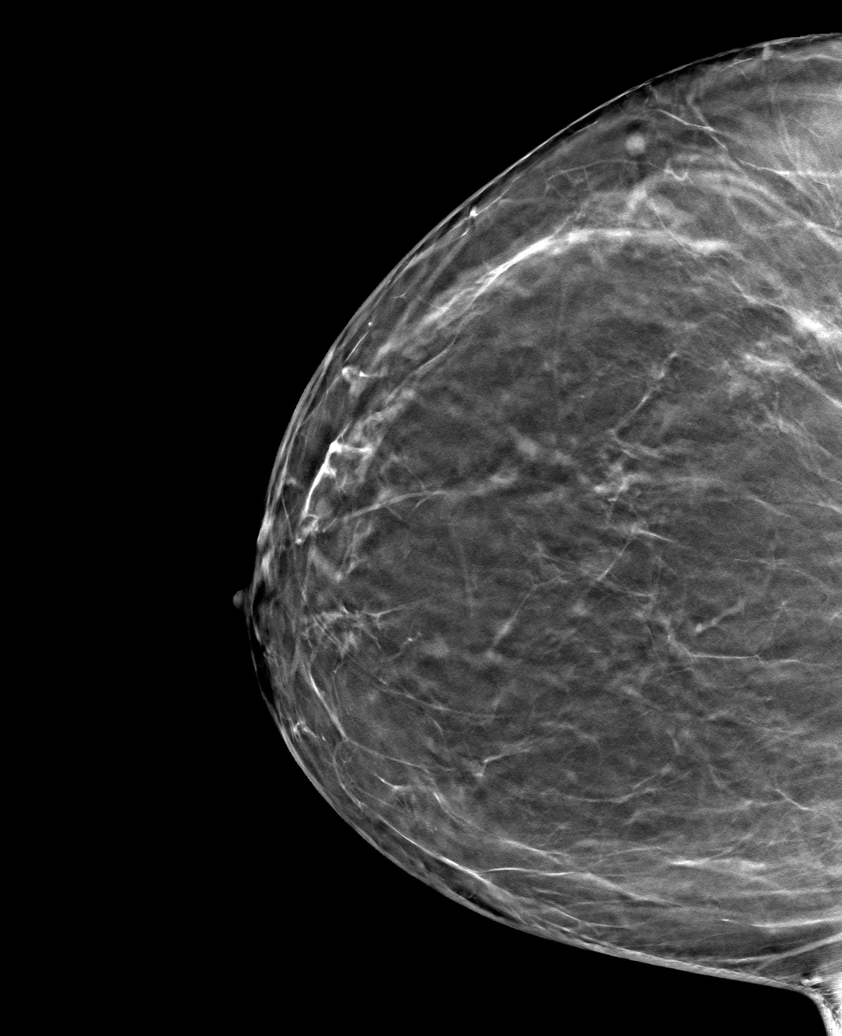

[6 of 30 positions shown; findings below may reference images not displayed]

ACR Breast Density Category b: There are scattered areas of
fibroglandular density.
FINDINGS: There are no findings suspicious for malignancy.
IMPRESSION: No mammographic evidence of malignancy. A result letter of this
screening mammogram will be mailed directly to the patient.

RECOMMENDATION:
Screening mammogram in one year. (Code:XG-X-X7B)

BI-RADS CATEGORY  1: Negative.

## 2024-01-12 ENCOUNTER — Encounter: Payer: Self-pay | Admitting: Physician Assistant

## 2024-01-17 NOTE — Progress Notes (Unsigned)
 Chief Complaint:  HPI:  *** is a  ***  who was referred to me by Elnora Ip,* for a complaint of *** .     No past medical history on file.  No past surgical history on file.  No current outpatient medications on file.   No current facility-administered medications for this visit.    Allergies as of 01/18/2024   (No Known Allergies)    Family History  Problem Relation Age of Onset   Breast cancer Neg Hx     Social History   Socioeconomic History   Marital status: Widowed    Spouse name: Not on file   Number of children: Not on file   Years of education: Not on file   Highest education level: Not on file  Occupational History   Not on file  Tobacco Use   Smoking status: Former    Types: Cigarettes    Start date: 06/25/1990   Smokeless tobacco: Former  Substance and Sexual Activity   Alcohol use: Not on file   Drug use: Not on file   Sexual activity: Not on file  Other Topics Concern   Not on file  Social History Narrative   Not on file   Social Drivers of Health   Financial Resource Strain: Not on file  Food Insecurity: Not at Risk (07/11/2023)   Received from [employer] Insecurity    Within the past 12 months, you worried that your food would run out before you got money to buy more.: 1  Transportation Needs: Not at Risk (07/11/2023)   Received from [employer] Needs    In the past 12 months, has lack of transportation kept you from medical appointments, meetings, work or from getting things needed for daily living?: 1  Physical Activity: Not on file  Stress: Not on file  Social Connections: Not on file  Intimate Partner Violence: Not on file    Review of Systems:    Constitutional: No weight loss, fever, chills, weakness or fatigue HEENT: Eyes: No change in vision               Ears, Nose, Throat:  No change in hearing or congestion Skin: No rash or itching Cardiovascular: No chest pain, chest pressure or palpitations    Respiratory: No SOB or cough Gastrointestinal: See HPI and otherwise negative Genitourinary: No dysuria or change in urinary frequency Neurological: No headache, dizziness or syncope Musculoskeletal: No new muscle or joint pain Hematologic: No bleeding or bruising Psychiatric: No history of depression or anxiety    Physical Exam:  Vital signs: There were no vitals taken for this visit.  Constitutional:   Pleasant Caucasian female appears to be in NAD, Well developed, Well nourished, alert and cooperative Head:  Normocephalic and atraumatic. Eyes:   PEERL, EOMI. No icterus. Conjunctiva pink. Ears:  Normal auditory acuity. Neck:  Supple Throat: Oral cavity and pharynx without inflammation, swelling or lesion.  Respiratory: Respirations even and unlabored. Lungs clear to auscultation bilaterally.   No wheezes, crackles, or rhonchi.  Cardiovascular: Normal S1, S2. No MRG. Regular rate and rhythm. No peripheral edema, cyanosis or pallor.  Gastrointestinal:  Soft, nondistended, nontender. No rebound or guarding. Normal bowel sounds. No appreciable masses or hepatomegaly. Rectal:  Not performed.  Msk:  Symmetrical without gross deformities. Without edema, no deformity or joint abnormality.  Neurologic:  Alert and  oriented x4;  grossly normal neurologically.  Skin:   Dry and intact without significant  lesions or rashes. Psychiatric: Oriented to person, place and time. Demonstrates good judgement and reason without abnormal affect or behaviors.  RELEVANT LABS AND IMAGING: CBC No results found for: WBC, RBC, HGB, HCT, PLT, MCV, MCH, MCHC, RDW, LYMPHSABS, MONOABS, EOSABS, BASOSABS  CMP  No results found for: NA, K, CL, CO2, GLUCOSE, BUN, CREATININE, CALCIUM, PROT, ALBUMIN, AST, ALT, ALKPHOS, BILITOT, GFRNONAA, GFRAA  Assessment: 1. ***  Plan: 1. ***     Delon Failing, PA-C [facility] 01/17/2024, 3:48  PM  Cc: [facility]

## 2024-01-18 ENCOUNTER — Ambulatory Visit: Admitting: Physician Assistant

## 2024-01-18 DIAGNOSIS — Z7689 Persons encountering health services in other specified circumstances: Secondary | ICD-10-CM
